# Patient Record
Sex: Female | Born: 1965 | Race: Black or African American | Hispanic: No | Smoking: Never smoker
Health system: Southern US, Community
[De-identification: ages and names within clinical notes are randomized; demographics above are authoritative.]

## PROBLEM LIST (undated history)

## (undated) DIAGNOSIS — K429 Umbilical hernia without obstruction or gangrene: Secondary | ICD-10-CM

## (undated) DIAGNOSIS — T7840XA Allergy, unspecified, initial encounter: Secondary | ICD-10-CM

## (undated) DIAGNOSIS — D649 Anemia, unspecified: Secondary | ICD-10-CM

## (undated) DIAGNOSIS — D259 Leiomyoma of uterus, unspecified: Secondary | ICD-10-CM

## (undated) DIAGNOSIS — E559 Vitamin D deficiency, unspecified: Secondary | ICD-10-CM

## (undated) DIAGNOSIS — N841 Polyp of cervix uteri: Secondary | ICD-10-CM

## (undated) DIAGNOSIS — N920 Excessive and frequent menstruation with regular cycle: Secondary | ICD-10-CM

## (undated) HISTORY — DX: Leiomyoma of uterus, unspecified: D25.9

## (undated) HISTORY — PX: EXTERNAL EAR SURGERY: SHX627

## (undated) HISTORY — DX: Allergy, unspecified, initial encounter: T78.40XA

## (undated) HISTORY — DX: Umbilical hernia without obstruction or gangrene: K42.9

## (undated) HISTORY — DX: Vitamin D deficiency, unspecified: E55.9

## (undated) HISTORY — DX: Excessive and frequent menstruation with regular cycle: N92.0

## (undated) HISTORY — PX: INNER EAR SURGERY: SHX679

## (undated) HISTORY — DX: Polyp of cervix uteri: N84.1

## (undated) HISTORY — PX: BREAST REDUCTION SURGERY: SHX8

---

## 1992-09-06 HISTORY — PX: REDUCTION MAMMAPLASTY: SUR839

## 2000-09-06 HISTORY — PX: BUNIONECTOMY: SHX129

## 2010-06-21 ENCOUNTER — Emergency Department (HOSPITAL_COMMUNITY): Admission: EM | Admit: 2010-06-21 | Discharge: 2010-06-21 | Payer: Self-pay | Admitting: Emergency Medicine

## 2011-08-30 ENCOUNTER — Emergency Department (HOSPITAL_COMMUNITY)
Admission: EM | Admit: 2011-08-30 | Discharge: 2011-08-30 | Discharge status: Against Medical Advice | Payer: Self-pay | Attending: Emergency Medicine | Admitting: Emergency Medicine

## 2011-08-30 DIAGNOSIS — Z0389 Encounter for observation for other suspected diseases and conditions ruled out: Secondary | ICD-10-CM | POA: Insufficient documentation

## 2011-08-30 NOTE — ED Notes (Signed)
patietn wanted to know she needed to be seen.

## 2012-03-17 ENCOUNTER — Encounter (INDEPENDENT_AMBULATORY_CARE_PROVIDER_SITE_OTHER): Payer: Self-pay

## 2012-03-20 ENCOUNTER — Ambulatory Visit (INDEPENDENT_AMBULATORY_CARE_PROVIDER_SITE_OTHER): Payer: BC Managed Care – PPO | Admitting: General Surgery

## 2012-03-20 ENCOUNTER — Encounter (INDEPENDENT_AMBULATORY_CARE_PROVIDER_SITE_OTHER): Payer: Self-pay | Admitting: General Surgery

## 2012-03-20 VITALS — BP 108/74 | HR 72 | Temp 98.8°F | Resp 12 | Ht 64.75 in | Wt 110.2 lb

## 2012-03-20 DIAGNOSIS — K429 Umbilical hernia without obstruction or gangrene: Secondary | ICD-10-CM | POA: Insufficient documentation

## 2012-03-20 NOTE — Progress Notes (Signed)
Patient ID: Whitney Le, female   DOB: 02/09/1966, 46 y.o.   MRN: [id number]  Chief Complaint  Patient presents with  . Umbilical Hernia    new pt- eval supra umb hernia    HPI Whitney Le is a 46 y.o. female.   HPI She is referred by Dr. Cherly Hensen for evaluation of an umbilical hernia. She has noticed a fullness around the umbilical area for a long time. When she eats a lot of food she notices it being more prominent.  It does not cause her any pain. It has not been getting larger. It is tender to the touch. She is going to have a hysterectomy by Dr. Cherly Hensen for symptomatic uterine fibroids and the thought was she could get her umbilical hernia repair at the same time.   No obstructive symptoms.  She notes that she has been slow to recover from her past surgeries.  Past Medical History  Diagnosis Date  . Cervical polyp   . Allergy     Past Surgical History  Procedure Date  . Bunionectomy 2002  . Inner ear surgery   . Breast reduction surgery     Family History  Problem Relation Age of Onset  . Cancer Mother     breast  . Cancer Sister     breast    Social History History  Substance Use Topics  . Smoking status: Never Smoker   . Smokeless tobacco: Not on file  . Alcohol Use: No    Allergies  Allergen Reactions  . Honey Bee Treatment (Bee Venom)   . Penicillins     Current Outpatient Prescriptions  Medication Sig Dispense Refill  . IRON CR PO Take by mouth.        Review of Systems Review of Systems  Constitutional: Negative.   Respiratory: Negative.   Cardiovascular: Negative.   Gastrointestinal: Negative.     Blood pressure 108/74, pulse 72, temperature 98.8 F (37.1 C), temperature source Temporal, resp. rate 12, height 5' 4.75" (1.645 m), weight 110 lb 3.2 oz (49.986 kg).  Physical Exam Physical Exam  Constitutional:       Thin female in NAD.  HENT:  Head: Normocephalic and atraumatic.  Eyes: No scleral icterus.  Abdominal: Soft. She  exhibits no distension. There is no tenderness.       Reducible, slightly tender, small supraumbilical hernia.  Diastasis recti is also present.  Musculoskeletal: She exhibits no edema.    Data Reviewed Dr.  Purnell Shoemaker notes.  Assessment    Small umbilical hernia with a very small fascial defect. Risk of intestinal incarceration is very small at this point. It has not changed in size.  Options at this point are elective repair or expectant management.    Plan    We discussed umbilical hernia repair with and without mesh. She is very concerned about the scar and I told her this would not be scarless surgery. We also discussed the cosmetic result regarding with the umbilicus which looked like. If she were going to have a laparoscopic robotic type surgery when the port site incision could go through the hernia and it be repaired primarily following that. Dr. Cherly Hensen has given her a number of options of how to perform the hysterectomy.  Whitney Le is going to see her and talk about what she options would be best for her. If she would like to have the hernia repaired at the same time I would be happy to coordinate that with Dr. Cherly Hensen.  I have discussed the procedure, risks, and aftercare.  Risks include but are not limited to bleeding, infection, wound problems, anesthesia, recurrence, injury to intestine, mesh problems.  We also discussed not knowing what the umbilicus would look like in the future.  She seems to understand.  I asked her to call us back if she would like to have the repair done.       Tiernan Millikin J 03/20/2012, 10:43 AM

## 2012-03-20 NOTE — Patient Instructions (Signed)
Call us if you would like to have your hernia fixed at the same time you are having your hysterectomy.

## 2012-03-29 ENCOUNTER — Encounter (HOSPITAL_COMMUNITY): Payer: Self-pay | Admitting: Pharmacy Technician

## 2012-03-29 ENCOUNTER — Other Ambulatory Visit: Payer: Self-pay | Admitting: Obstetrics and Gynecology

## 2012-04-03 ENCOUNTER — Encounter (HOSPITAL_COMMUNITY)
Admission: RE | Admit: 2012-04-03 | Discharge: 2012-04-03 | Disposition: A | Discharge status: Home / Self Care | Payer: BC Managed Care – PPO | Source: Ambulatory Visit | Attending: Obstetrics and Gynecology | Admitting: Obstetrics and Gynecology

## 2012-04-03 ENCOUNTER — Encounter (HOSPITAL_COMMUNITY): Payer: Self-pay

## 2012-04-03 HISTORY — DX: Anemia, unspecified: D64.9

## 2012-04-03 LAB — BASIC METABOLIC PANEL
GFR calc Af Amer: >90 mL/min (ref >90)
GFR calc non Af Amer: 79 mL/min — ABNORMAL LOW (ref >90)
Glucose, Bld: 86 mg/dL (ref 70–99)
Potassium: 3.9 mEq/L (ref 3.5–5.1)
Sodium: 140 mEq/L (ref 135–145)

## 2012-04-03 LAB — SURGICAL PCR SCREEN
MRSA, PCR: NEGATIVE
Staphylococcus aureus: NEGATIVE

## 2012-04-03 LAB — CBC
Hemoglobin: 12.6 g/dL (ref 12.0–15.0)
RBC: 4.56 MIL/uL (ref 3.87–5.11)

## 2012-04-03 NOTE — Patient Instructions (Signed)
Whitney Le  04/03/2012   Your procedure is scheduled on:  04/07/12  Surgery 1300-1500  FRIDAY  Report to [facility] at  1030     AM.  Call this number if you have problems the morning of surgery: [phone]     Or PST   [id number]  [facility]   Remember:   Do not eat food or drink any :After Midnight. Thursday NIGHT  Or as directed by office    Nothing after midnight Thursday NIGHT  Take these medicines the morning of surgery with A SIP OF WATER:none   Do not wear jewelry, make-up or nail polish.  Do not wear lotions, powders, or perfumes. You may wear deodorant.  Do not shave 48 hours prior to surgery.  Do not bring valuables to the hospital.  Contacts, dentures or bridgework may not be worn into surgery.  Leave suitcase in the car. After surgery it may be brought to your room.  For patients admitted to the hospital, checkout time is 11:00 AM the day of discharge.   Patients discharged the day of surgery will not be allowed to drive home.  Name and phone number of your driver:  daughter                                                                    Special Instructions: CHG Shower Use Special Wash: 1/2 bottle night before surgery and 1/2 bottle morning of surgery. REGULAR SOAP FACE AND PRIVATES              LADIES- NO SHAVING 48 HOURS BEFORE USING BETASEPT SOAP.                 Please read over the following fact sheets that you were given: MRSA Information

## 2012-04-03 NOTE — Pre-Procedure Instructions (Signed)
Left voice mail with Hughes Better at Dr Cherly Hensen office regarding clarification of TED hose length, ?Type and Screen Order needed pre op labs?  Also requested for abnormal WBC to be reviewed by MD  Requested call back for verification of message received at 1305

## 2012-04-07 ENCOUNTER — Encounter (HOSPITAL_COMMUNITY)
Admission: RE | Disposition: A | Discharge status: Home / Self Care | Payer: Self-pay | Source: Ambulatory Visit | Attending: Obstetrics and Gynecology

## 2012-04-07 ENCOUNTER — Encounter (HOSPITAL_COMMUNITY): Payer: Self-pay | Admitting: *Deleted

## 2012-04-07 ENCOUNTER — Encounter (HOSPITAL_COMMUNITY): Payer: Self-pay | Admitting: Anesthesiology

## 2012-04-07 ENCOUNTER — Other Ambulatory Visit: Payer: Self-pay

## 2012-04-07 ENCOUNTER — Ambulatory Visit (HOSPITAL_COMMUNITY)
Admission: RE | Admit: 2012-04-07 | Discharge: 2012-04-08 | Disposition: A | Discharge status: Home / Self Care | Payer: BC Managed Care – PPO | Source: Ambulatory Visit | Attending: Obstetrics and Gynecology | Admitting: Obstetrics and Gynecology

## 2012-04-07 ENCOUNTER — Ambulatory Visit (HOSPITAL_COMMUNITY): Payer: BC Managed Care – PPO | Admitting: Anesthesiology

## 2012-04-07 DIAGNOSIS — Z90710 Acquired absence of both cervix and uterus: Secondary | ICD-10-CM

## 2012-04-07 DIAGNOSIS — D259 Leiomyoma of uterus, unspecified: Principal | ICD-10-CM | POA: Insufficient documentation

## 2012-04-07 DIAGNOSIS — Z01812 Encounter for preprocedural laboratory examination: Secondary | ICD-10-CM | POA: Insufficient documentation

## 2012-04-07 DIAGNOSIS — N920 Excessive and frequent menstruation with regular cycle: Secondary | ICD-10-CM | POA: Insufficient documentation

## 2012-04-07 DIAGNOSIS — D649 Anemia, unspecified: Secondary | ICD-10-CM | POA: Insufficient documentation

## 2012-04-07 HISTORY — PX: ABDOMINAL HYSTERECTOMY: SHX81

## 2012-04-07 LAB — GLUCOSE, CAPILLARY: Glucose-Capillary: 155 mg/dL — ABNORMAL HIGH (ref 70–99)

## 2012-04-07 LAB — TYPE AND SCREEN

## 2012-04-07 SURGERY — ROBOTIC ASSISTED TOTAL HYSTERECTOMY
Anesthesia: General | Site: Abdomen | Wound class: Clean Contaminated

## 2012-04-07 MED ORDER — KETOROLAC TROMETHAMINE 30 MG/ML IJ SOLN
30.0000 mg | Freq: Four times a day (QID) | INTRAMUSCULAR | Status: DC
  Administered 2012-04-07 – 2012-04-08 (×2): 30 mg via INTRAVENOUS
  Filled 2012-04-07 (×5): qty 1

## 2012-04-07 MED ORDER — MENTHOL 3 MG MT LOZG: 1.0000 | LOZENGE | OROMUCOSAL | Status: DC | PRN

## 2012-04-07 MED ORDER — STERILE WATER FOR IRRIGATION IR SOLN
Status: DC | PRN
  Administered 2012-04-07: 3000 mL

## 2012-04-07 MED ORDER — IBUPROFEN 800 MG PO TABS: 800.0000 mg | ORAL_TABLET | Freq: Three times a day (TID) | ORAL | Status: DC | PRN

## 2012-04-07 MED ORDER — ONDANSETRON HCL 4 MG/2ML IJ SOLN: 4.0000 mg | Freq: Four times a day (QID) | INTRAMUSCULAR | Status: DC | PRN

## 2012-04-07 MED ORDER — GLYCOPYRROLATE 0.2 MG/ML IJ SOLN
INTRAMUSCULAR | Status: DC | PRN
  Administered 2012-04-07: 0.4 mg via INTRAVENOUS

## 2012-04-07 MED ORDER — OXYCODONE-ACETAMINOPHEN 5-325 MG PO TABS: 1.0000 | ORAL_TABLET | ORAL | Status: DC | PRN

## 2012-04-07 MED ORDER — BUPIVACAINE HCL (PF) 0.25 % IJ SOLN
INTRAMUSCULAR | Status: AC
  Filled 2012-04-07: qty 30

## 2012-04-07 MED ORDER — ZOLPIDEM TARTRATE 5 MG PO TABS: 5.0000 mg | ORAL_TABLET | Freq: Every evening | ORAL | Status: DC | PRN

## 2012-04-07 MED ORDER — DEXAMETHASONE SODIUM PHOSPHATE 10 MG/ML IJ SOLN
INTRAMUSCULAR | Status: DC | PRN
  Administered 2012-04-07: 10 mg via INTRAVENOUS

## 2012-04-07 MED ORDER — BUPIVACAINE HCL 0.25 % IJ SOLN
INTRAMUSCULAR | Status: DC | PRN
  Administered 2012-04-07: 10 mL

## 2012-04-07 MED ORDER — HYDROMORPHONE HCL PF 1 MG/ML IJ SOLN
INTRAMUSCULAR | Status: AC
  Filled 2012-04-07: qty 1

## 2012-04-07 MED ORDER — NALOXONE HCL 0.4 MG/ML IJ SOLN: 0.4000 mg | INTRAMUSCULAR | Status: DC | PRN

## 2012-04-07 MED ORDER — LACTATED RINGERS IV SOLN
INTRAVENOUS | Status: DC | PRN
  Administered 2012-04-07 (×2): via INTRAVENOUS

## 2012-04-07 MED ORDER — ONDANSETRON HCL 4 MG/2ML IJ SOLN
INTRAMUSCULAR | Status: DC | PRN
  Administered 2012-04-07: 4 mg via INTRAVENOUS

## 2012-04-07 MED ORDER — PROPOFOL 10 MG/ML IV BOLUS
INTRAVENOUS | Status: DC | PRN
  Administered 2012-04-07: 100 mg via INTRAVENOUS

## 2012-04-07 MED ORDER — LACTATED RINGERS IV SOLN
INTRAVENOUS | Status: DC
  Administered 2012-04-07: 1000 mL via INTRAVENOUS

## 2012-04-07 MED ORDER — CIPROFLOXACIN IN D5W 400 MG/200ML IV SOLN
INTRAVENOUS | Status: AC
  Filled 2012-04-07: qty 200

## 2012-04-07 MED ORDER — FENTANYL CITRATE 0.05 MG/ML IJ SOLN
INTRAMUSCULAR | Status: DC | PRN
  Administered 2012-04-07: 50 ug via INTRAVENOUS
  Administered 2012-04-07: 100 ug via INTRAVENOUS
  Administered 2012-04-07 (×3): 50 ug via INTRAVENOUS

## 2012-04-07 MED ORDER — PANTOPRAZOLE SODIUM 40 MG PO TBEC: 40.0000 mg | DELAYED_RELEASE_TABLET | Freq: Every day | ORAL | Status: DC

## 2012-04-07 MED ORDER — CLINDAMYCIN PHOSPHATE 900 MG/50ML IV SOLN
INTRAVENOUS | Status: AC
  Filled 2012-04-07: qty 50

## 2012-04-07 MED ORDER — NEOSTIGMINE METHYLSULFATE 1 MG/ML IJ SOLN
INTRAMUSCULAR | Status: DC | PRN
  Administered 2012-04-07: 5 mg via INTRAVENOUS

## 2012-04-07 MED ORDER — DIPHENHYDRAMINE HCL 50 MG/ML IJ SOLN: 12.5000 mg | Freq: Four times a day (QID) | INTRAMUSCULAR | Status: DC | PRN

## 2012-04-07 MED ORDER — KETOROLAC TROMETHAMINE 30 MG/ML IJ SOLN: 30.0000 mg | Freq: Four times a day (QID) | INTRAMUSCULAR | Status: DC

## 2012-04-07 MED ORDER — LACTATED RINGERS IR SOLN
Status: DC | PRN
  Administered 2012-04-07: 1000 mL

## 2012-04-07 MED ORDER — CIPROFLOXACIN IN D5W 400 MG/200ML IV SOLN
400.0000 mg | INTRAVENOUS | Status: AC
  Administered 2012-04-07: 400 mg via INTRAVENOUS

## 2012-04-07 MED ORDER — ONDANSETRON HCL 4 MG PO TABS: 4.0000 mg | ORAL_TABLET | Freq: Four times a day (QID) | ORAL | Status: DC | PRN

## 2012-04-07 MED ORDER — KETOROLAC TROMETHAMINE 30 MG/ML IJ SOLN
30.0000 mg | Freq: Four times a day (QID) | INTRAMUSCULAR | Status: DC
  Filled 2012-04-07 (×5): qty 1

## 2012-04-07 MED ORDER — ONDANSETRON HCL 4 MG/2ML IJ SOLN
4.0000 mg | Freq: Four times a day (QID) | INTRAMUSCULAR | Status: DC | PRN
  Filled 2012-04-07: qty 2

## 2012-04-07 MED ORDER — HYDROMORPHONE HCL PF 1 MG/ML IJ SOLN
0.2500 mg | INTRAMUSCULAR | Status: DC | PRN
  Administered 2012-04-07 (×2): 0.5 mg via INTRAVENOUS

## 2012-04-07 MED ORDER — MIDAZOLAM HCL 5 MG/5ML IJ SOLN
INTRAMUSCULAR | Status: DC | PRN
  Administered 2012-04-07 (×2): 1 mg via INTRAVENOUS

## 2012-04-07 MED ORDER — HYDROMORPHONE 0.3 MG/ML IV SOLN
INTRAVENOUS | Status: AC
  Filled 2012-04-07: qty 25

## 2012-04-07 MED ORDER — HYDROMORPHONE HCL PF 1 MG/ML IJ SOLN: 0.2000 mg | INTRAMUSCULAR | Status: DC | PRN

## 2012-04-07 MED ORDER — SODIUM CHLORIDE 0.9 % IJ SOLN: 9.0000 mL | INTRAMUSCULAR | Status: DC | PRN

## 2012-04-07 MED ORDER — KETOROLAC TROMETHAMINE 30 MG/ML IJ SOLN
INTRAMUSCULAR | Status: DC | PRN
  Administered 2012-04-07: 30 mg via INTRAVENOUS

## 2012-04-07 MED ORDER — HYDROMORPHONE 0.3 MG/ML IV SOLN
INTRAVENOUS | Status: DC
  Administered 2012-04-07: 0.2 mg via INTRAVENOUS
  Administered 2012-04-08: 0.399 mg via INTRAVENOUS
  Administered 2012-04-08: 0.3 mg via INTRAVENOUS

## 2012-04-07 MED ORDER — DEXTROSE IN LACTATED RINGERS 5 % IV SOLN
INTRAVENOUS | Status: DC
  Administered 2012-04-08: 04:00:00 via INTRAVENOUS

## 2012-04-07 MED ORDER — DIPHENHYDRAMINE HCL 12.5 MG/5ML PO ELIX: 12.5000 mg | ORAL_SOLUTION | Freq: Four times a day (QID) | ORAL | Status: DC | PRN

## 2012-04-07 MED ORDER — ACETAMINOPHEN 10 MG/ML IV SOLN
INTRAVENOUS | Status: DC | PRN
  Administered 2012-04-07: 1000 mg via INTRAVENOUS

## 2012-04-07 MED ORDER — ACETAMINOPHEN 10 MG/ML IV SOLN
INTRAVENOUS | Status: AC
  Filled 2012-04-07: qty 100

## 2012-04-07 MED ORDER — ROCURONIUM BROMIDE 100 MG/10ML IV SOLN
INTRAVENOUS | Status: DC | PRN
  Administered 2012-04-07 (×2): 10 mg via INTRAVENOUS
  Administered 2012-04-07: 5 mg via INTRAVENOUS
  Administered 2012-04-07: 40 mg via INTRAVENOUS

## 2012-04-07 MED ORDER — CLINDAMYCIN PHOSPHATE 900 MG/50ML IV SOLN
900.0000 mg | INTRAVENOUS | Status: AC
  Administered 2012-04-07: 900 mg via INTRAVENOUS
  Filled 2012-04-07 (×2): qty 50

## 2012-04-07 MED ORDER — PANTOPRAZOLE SODIUM 40 MG PO TBEC
40.0000 mg | DELAYED_RELEASE_TABLET | Freq: Every day | ORAL | Status: DC
  Filled 2012-04-07: qty 1

## 2012-04-07 SURGICAL SUPPLY — 60 items
BARRIER ADHS 3X4 INTERCEED (GAUZE/BANDAGES/DRESSINGS) IMPLANT
BENZOIN TINCTURE PRP APPL 2/3 (GAUZE/BANDAGES/DRESSINGS) IMPLANT
CHLORAPREP W/TINT 26ML (MISCELLANEOUS) ×4 IMPLANT
CLOTH BEACON ORANGE TIMEOUT ST (SAFETY) ×4 IMPLANT
COVER MAYO STAND STRL (DRAPES) ×4 IMPLANT
COVER SURGICAL LIGHT HANDLE (MISCELLANEOUS) ×4 IMPLANT
COVER TIP SHEARS 8 DVNC (MISCELLANEOUS) ×3 IMPLANT
COVER TIP SHEARS 8MM DA VINCI (MISCELLANEOUS) ×1
DECANTER SPIKE VIAL GLASS SM (MISCELLANEOUS) IMPLANT
DERMABOND ADVANCED (GAUZE/BANDAGES/DRESSINGS) ×1
DERMABOND ADVANCED .7 DNX12 (GAUZE/BANDAGES/DRESSINGS) ×3 IMPLANT
DRAPE LG THREE QUARTER DISP (DRAPES) ×8 IMPLANT
DRAPE SURG IRRIG POUCH 19X23 (DRAPES) ×4 IMPLANT
DRAPE TABLE BACK 44X90 PK DISP (DRAPES) ×8 IMPLANT
DRAPE WARM FLUID 44X44 (DRAPE) ×4 IMPLANT
DRSG TEGADERM 6X8 (GAUZE/BANDAGES/DRESSINGS) ×8 IMPLANT
ELECT REM PT RETURN 9FT ADLT (ELECTROSURGICAL) ×4
ELECTRODE REM PT RTRN 9FT ADLT (ELECTROSURGICAL) ×3 IMPLANT
FILTER SMOKE EVAC LAPAROSHD (FILTER) ×4 IMPLANT
GAUZE VASELINE 3X9 (GAUZE/BANDAGES/DRESSINGS) IMPLANT
GLOVE BIOGEL PI IND STRL 7.0 (GLOVE) ×6 IMPLANT
GLOVE BIOGEL PI INDICATOR 7.0 (GLOVE) ×2
GLOVE ECLIPSE 6.5 STRL STRAW (GLOVE) ×16 IMPLANT
GOWN STRL NON-REIN LRG LVL3 (GOWN DISPOSABLE) ×12 IMPLANT
KIT ACCESSORY DA VINCI DISP (KITS) ×1
KIT ACCESSORY DVNC DISP (KITS) ×3 IMPLANT
MANIPULATOR UTERINE 4.5 ZUMI (MISCELLANEOUS) IMPLANT
NEEDLE INSUFFLATION 14GA 120MM (NEEDLE) ×4 IMPLANT
OCCLUDER COLPOPNEUMO (BALLOONS) IMPLANT
PACK LAPAROSCOPY W LONG (CUSTOM PROCEDURE TRAY) ×4 IMPLANT
PENCIL BUTTON HOLSTER BLD 10FT (ELECTRODE) ×4 IMPLANT
SET TUBE IRRIG SUCTION NO TIP (IRRIGATION / IRRIGATOR) ×4 IMPLANT
SHEET LAVH (DRAPES) ×4 IMPLANT
SLEEVE SURGEON STRL (DRAPES) ×4 IMPLANT
SOLUTION ELECTROLUBE (MISCELLANEOUS) ×4 IMPLANT
SPONGE LAP 18X18 X RAY DECT (DISPOSABLE) IMPLANT
STRIP CLOSURE SKIN 1/2X4 (GAUZE/BANDAGES/DRESSINGS) IMPLANT
SUT VIC AB 0 CT1 27 (SUTURE) ×7
SUT VIC AB 0 CT1 27XBRD ANTBC (SUTURE) ×21 IMPLANT
SUT VIC AB 4-0 PS2 27 (SUTURE) ×8 IMPLANT
SUT VICRYL 0 UR6 27IN ABS (SUTURE) ×4 IMPLANT
SYR 20CC LL (SYRINGE) ×8 IMPLANT
SYR 50ML LL SCALE MARK (SYRINGE) ×4 IMPLANT
SYSTEM CONVERTIBLE TROCAR (TROCAR) ×4 IMPLANT
TIP UTERINE 5.1X6CM LAV DISP (MISCELLANEOUS) IMPLANT
TIP UTERINE 6.7X10CM GRN DISP (MISCELLANEOUS) ×4 IMPLANT
TIP UTERINE 6.7X6CM WHT DISP (MISCELLANEOUS) IMPLANT
TIP UTERINE 6.7X8CM BLUE DISP (MISCELLANEOUS) IMPLANT
TOWEL OR 17X26 10 PK STRL BLUE (TOWEL DISPOSABLE) ×8 IMPLANT
TRAY FOLEY CATH 14FRSI W/METER (CATHETERS) ×4 IMPLANT
TROCAR 12M 150ML BLUNT (TROCAR) IMPLANT
TROCAR BLADELESS OPT 5 75 (ENDOMECHANICALS) ×4 IMPLANT
TROCAR DISP BLADELESS 8 DVNC (TROCAR) ×3 IMPLANT
TROCAR DISP BLADELESS 8MM (TROCAR) ×1
TROCAR HASSON GELL 12X100 (TROCAR) ×4 IMPLANT
TROCAR KII 12MM C0R66 BLD (TROCAR) IMPLANT
TROCAR XCEL 12X100 BLDLESS (ENDOMECHANICALS) ×4 IMPLANT
TROCAR Z-THREAD 12X150 (TROCAR) ×4 IMPLANT
TUBING FILTER THERMOFLATOR (ELECTROSURGICAL) ×4 IMPLANT
WATER STERILE IRR 1500ML POUR (IV SOLUTION) ×8 IMPLANT

## 2012-04-07 NOTE — Transfer of Care (Signed)
Immediate Anesthesia Transfer of Care Note  Patient: Whitney Le  Procedure(s) Performed: Procedure(s) (LRB): ROBOTIC ASSISTED TOTAL HYSTERECTOMY (N/A) ROBOTIC ASSISTED SALPINGO OOPHERECTOMY (N/A)  Patient Location: PACU  Anesthesia Type: General  Level of Consciousness: awake, patient cooperative, lethargic and responds to stimulation  Airway & Oxygen Therapy: Patient Spontanous Breathing and Patient connected to face mask oxygen  Post-op Assessment: Report given to PACU RN, Post -op Vital signs reviewed and stable and Patient moving all extremities  Post vital signs: Reviewed and stable  Complications: No apparent anesthesia complications

## 2012-04-07 NOTE — Preoperative (Addendum)
Beta Blockers   Reason not to administer Beta Blockers:Not Applicable 

## 2012-04-07 NOTE — Anesthesia Preprocedure Evaluation (Signed)
Anesthesia Evaluation  Patient identified by MRN, date of birth, ID band Patient awake    Reviewed: Allergy & Precautions, H&P , NPO status , Patient's Chart, lab work & pertinent test results, reviewed documented beta blocker date and time   Airway Mallampati: II TM Distance: >3 FB Neck ROM: Full    Dental  (+) Teeth Intact and Dental Advisory Given   Pulmonary neg pulmonary ROS,  breath sounds clear to auscultation        Cardiovascular negative cardio ROS  Rhythm:Regular Rate:Normal  Denies cardiac symptoms   Neuro/Psych negative neurological ROS  negative psych ROS   GI/Hepatic negative GI ROS, Neg liver ROS,   Endo/Other  negative endocrine ROS  Renal/GU negative Renal ROS   Fibroids    Musculoskeletal negative musculoskeletal ROS (+)   Abdominal   Peds negative pediatric ROS (+)  Hematology negative hematology ROS (+)   Anesthesia Other Findings   Reproductive/Obstetrics negative OB ROS                           Anesthesia Physical Anesthesia Plan  ASA: I  Anesthesia Plan: General   Post-op Pain Management:    Induction: Intravenous  Airway Management Planned: Oral ETT  Additional Equipment:   Intra-op Plan:   Post-operative Plan: Extubation in OR  Informed Consent: I have reviewed the patients History and Physical, chart, labs and discussed the procedure including the risks, benefits and alternatives for the proposed anesthesia with the patient or authorized representative who has indicated his/her understanding and acceptance.   Dental advisory given  Plan Discussed with: CRNA and Surgeon  Anesthesia Plan Comments:         Anesthesia Quick Evaluation

## 2012-04-07 NOTE — Progress Notes (Signed)
Called to room 1232 at 2030 per Dondra Spry. RN 5 W concerning pt sob low 02 sats and chest pressure. Pt had hysterectomy today and is admitted to 5 west for observation overnight. Upon my arriva Pt complaining of weakness, denies pain or chest pressure at this time.  Po2 on pca monitor reading 86-89, lung sounds clear, EKG completed in room reading NSR.  Pt placed on RRT monitor yielding o2 sats 100%. New probe placed on pca 02 sensor, with sensor now correlating well with RRT monitor. Pt and sister at bedside educated on side effect post op including weakness and gas pressure after lap surgery. Encouraged to use PCA button when needed for pain. Page sent out to Dr Cherly Hensen per Graciella Belton RN to update on pt status. Pt left resting in bed VSS. Se VS on flowsheet.

## 2012-04-07 NOTE — Brief Op Note (Signed)
04/07/2012  4:44 PM  PATIENT: Whitney Le PRE-OPERATIVE DIAGNOSIS:  fibroids and menorrhagia  POST-OPERATIVE DIAGNOSIS:  fibroids and menorrhagia  PROCEDURE: DaVinci robotic total hysterectomy, bilateral salpingectomy  SURGEON:  Surgeon(s): Yasseen Salls Cathie Beams, MD Genia Del, MD  ASSISTANTS: Genia Del MD   ANESTHESIA:   general  FINDINGS:fibroid uterus, nl tubes and ovaries, pt is on cycle, nl liver edge    ESTIMATED BLOOD LOSS: 50  Intake/Output Summary (Last 24 hours) at 04/07/12 1644 Last data filed at 04/07/12 1600  Gross per 24 hour  Intake   2000 ml  Output    125 ml  Net   1875 ml     BLOOD ADMINISTERED:none   SPECIMEN: uterus w/ cervix  DISPOSITION OF SPECIMEN:  PATHOLOGY  COUNTS:  YES  PLAN OF CARE: overnight observation

## 2012-04-08 LAB — BASIC METABOLIC PANEL
Calcium: 8.2 mg/dL — ABNORMAL LOW (ref 8.4–10.5)
GFR calc Af Amer: >90 mL/min (ref >90)
GFR calc non Af Amer: >90 mL/min (ref >90)
Potassium: 4.4 mEq/L (ref 3.5–5.1)
Sodium: 132 mEq/L — ABNORMAL LOW (ref 135–145)

## 2012-04-08 LAB — CBC
Hemoglobin: 10.9 g/dL — ABNORMAL LOW (ref 12.0–15.0)
MCH: 27.9 pg (ref 26.0–34.0)
MCHC: 33.5 g/dL (ref 30.0–36.0)
Platelets: 235 10*3/uL (ref 150–400)
RDW: 14.4 % (ref 11.5–15.5)

## 2012-04-08 MED ORDER — PROMETHAZINE HCL 50 MG PO TABS: 25.0000 mg | ORAL_TABLET | Freq: Four times a day (QID) | ORAL | Status: DC | PRN

## 2012-04-08 NOTE — Anesthesia Postprocedure Evaluation (Signed)
  Anesthesia Post-op Note  Patient: Whitney Le  Procedure(s) Performed: Procedure(s) (LRB): ROBOTIC ASSISTED TOTAL HYSTERECTOMY (N/A) BILATERAL SALPINGECTOMY ()  Patient Location: PACU  Anesthesia Type: General  Level of Consciousness: oriented and sedated  Airway and Oxygen Therapy: Patient Spontanous Breathing and Patient connected to nasal cannula oxygen  Post-op Pain: mild  Post-op Assessment: Post-op Vital signs reviewed, Patient's Cardiovascular Status Stable, Respiratory Function Stable and Patent Airway  Post-op Vital Signs: stable  Complications: No apparent anesthesia complications

## 2012-04-08 NOTE — Discharge Summary (Signed)
Physician Discharge Summary  Patient ID: Whitney Le MRN: [id number] DOB/AGE: Aug 30, 1966 46 y.o.  Admit date: 04/07/2012 Discharge date: 04/08/2012  Admission Diagnoses: fibroid uterus, menorrhagia  Discharge Diagnoses: fibroid uterus, menorrhagia Active Problems:  * No active hospital problems. *    Discharged Condition: stable  Hospital Course: uncomplicated postop course. S/p robotic Total hysterectomy, bilateral salpingectomy. Had transient nausea prob related to anesthesia. Abdomen nondistended Consults: None  Significant Diagnostic Studies: labs: hgb 10.9 hct 32.5 plt 235K, wbc 8.7  Creatinine 0.74  Treatments: surgery: Davinci robotic total hysterectomy, bilateral slapingectomy  Discharge Exam: Blood pressure 92/58, pulse 72, temperature 98.4 F (36.9 C), temperature source Oral, resp. rate 21, height 5\' 4"  (1.626 m), weight 50.803 kg (112 lb), SpO2 100.00%. General appearance: alert, cooperative and no distress Resp: clear to auscultation bilaterally Cardio: regular rate and rhythm, S1, S2 normal, no murmur, click, rub or gallop GI: soft, non-tender; bowel sounds normal; no masses,  no organomegaly Extremities: no edema, redness or tenderness in the calves or thighs Incision/Wound:incisions well approximated, nontender  Disposition: 07-Left Against Medical Advice  Discharge Orders    Future Orders Please Complete By Expires   Diet general      May walk up steps      Discharge instructions      Comments:   Call if temperature greater than equal to 100.4, nothing per vagina for 4-6 weeks or severe nausea vomiting, increased incisional pain , drainage or redness in the incision site, no straining with bowel movements, showers no bath   Discharge patient        Medication List  As of 04/08/2012  8:55 AM   TAKE these medications         ferrous sulfate 325 (65 FE) MG tablet   Take 325 mg by mouth daily with breakfast.      promethazine 50 MG tablet   Commonly  known as: PHENERGAN   Take 0.5 tablets (25 mg total) by mouth every 6 (six) hours as needed for nausea.           Follow-up Information    Follow up with Barkley Kratochvil A, MD in 2 weeks.   Contact information:   [address] [place] [address] [phone]          Signed: Serita Kyle 04/08/2012, 8:55 AM

## 2012-04-08 NOTE — Op Note (Signed)
Whitney Le, BEAUFORT NO.:  [id number]  MEDICAL RECORD NO.:  [id number]  LOCATION:  1532                         FACILITY:  [facility]  PHYSICIAN:  Maxie Better, M.D.DATE OF BIRTH:  August 06, 1966  DATE OF PROCEDURE:  04/07/2012 DATE OF DISCHARGE:                              OPERATIVE REPORT   PREOPERATIVE DIAGNOSES:  Menorrhagia, fibroid uterus.  PROCEDURES:  Da Vinci robotic total hysterectomy and bilateral salpingectomy.  POSTOPERATIVE DIAGNOSIS:  Fibroid uterus, menorrhagia.  ANESTHESIA:  General.  SURGEON:  Maxie Better, M.D.  ASSISTANT:  Genia Del, M.D.  PROCEDURE DETAILS:  Under adequate general anesthesia, the patient was placed in the dorsal lithotomy position.  She was sterilely prepped and draped in the usual fashion.  The patient was positioned for robotic surgery.  Examination under anesthesia had revealed an anteverted uterus about 10-week size, irregular, no adnexal masses could be appreciated. A weighted speculum was placed in the vagina.  Sims retractor was placed anteriorly.  The cervix was parous.  A 0 Vicryl figure-of-eight suture was placed on the anterior posterior lip of the cervix.  The uterus sounded to 10 cm.  The cervix was already dilated.  The patient was on her cycle.  A medium sized KOH RUMI cup a #10 uterine manipulator were introduced into the uterine cavity without difficulty.  The balloon was insufflated.  The instruments were then removed.  Attention was then turned to the abdomen.  A 0.25% Marcaine was injected in the vertical fashion supraumbilically.  The incision was then made.  Veress needle was introduced, tested.  Carbon dioxide was insufflated.  Opening pressure of 6 was noted.  Veress needle was then removed after 3 L of carbon dioxide.  The 12-mm disposable trocar was introduced into the abdomen without incident.  The robotic camera was then placed.  The pelvis had some bleeding with respect to  the patient's cycles.  The patient was placed in Trendelenburg position.  The pelvis was inspected. Upper abdomen was normal.  Uterus was irregular.  Two 8-mm robotic port sites were placed on the left and one on the right, hand preservation to each other, and a 5-mm assistant port was then placed in the right lower quadrant.  The additional robotic port site was placed under direct visualization.  Once this was done, the robot was docked to the patient's left side.  The monopolar scissors in arm 1, PK dissector in arm 2, and the Prograsp in 3 was placed.  I then went to the surgical console.  At the surgical console, the pelvis was inspected.  Some areas of suggestion of endometriosis noted with some scarring, but however, the patient was on her cycle, difficult to evaluate.  The procedure was began by cauterizing the round ligaments on the right.  I severed the right broad ligaments followed by the fallopian tubing and the mesosalpinx being serially clamped, cauterized, and then cut.  The right utero-ovarian ligament was then clamped, cauterized, and cut.  The anterior and posterior leaf of the broad ligament was opened.  The bladder reflection was opened transversely.  The uterine vessel was noted.  They were cauterized, but not cut.  The right ureter as it has been seen  peristalsing the pelvis.  The bladder was then dissected off the lower uterine segment and over the RUMI cup.  On the opposite side, the ureter was not seen as well, was deep in the pelvis.  The left round ligament was cauterized, clamped, and cut.  The fallopian tube was grasped, and the underlying mesosalpinx was serially clamped, cauterized, and cut.  The left utero-ovarian ligaments were clamped, cauterized, and cut.  The broad ligament was then opened anteriorly and posteriorly.  Uterine vessels were skeletonized.  They were clearly clamped, cauterized, and then cut.  Attention was then brought back to the right  side, which was also further cauterized and cut.  Once this was done, the bladder was displaced inferiorly.  A circumferential incision was then made at the cervical vaginal junction above the RUMI cup.  This was done circumferentially.  Once this was done, the uterus was then removed through the vagina.  The vaginal cuff was inspected. Small bleeders were cauterized.  The occluder was reinserted.  The PK and the monopolar scissors were replaced by the suture driver and a long tip forceps.  A 0 Vicryl figure-of-eight sutures was then dropped down through the supraumbilical port sites, and the vaginal cuff was closed with interrupted figure-of-eight suture and one single suture of 0 Vicryl. The integrity of the vaginal cuff was then inspected.  It was intact. The abdomen was copiously irrigated and suctioned.  Good hemostasis was noted.  At that point, the procedure was felt to be complete.  The robot was undocked, and robotic camera was placed and the needles were then removed.  The pelvis was again inspected.  Good hemostasis noted.  At that point, the sites were removed under direct visualization.  The vagina had been inspected to be intact.  The incisions were closed with 0 Vicryl figure-of-eight to the subcuticular stitch and the subcuticular sutured of closure of 4-0 Vicryl.  The vagina was then again inspected and well approximated.   SPECIMEN:  Uterus with cervix and fallopian tubes sent to pathology.  ESTIMATED BLOOD LOSS:  50 mL.  INTRAOPERATIVE FLUID:  2500 mL.  URINE OUTPUT:  125 mL.  COUNTS:  Sponge and instrument counts x2 was correct.  COMPLICATION:  None.  The patient tolerated the procedure well, was transferred to recovery in stable condition.     Maxie Better, M.D.     [place]/MEDQ  D:  04/08/2012  T:  04/08/2012  Job:  [id number]

## 2012-04-08 NOTE — Progress Notes (Signed)
POD #1 S/P Robotic Total hysterectomy, bilateral salpingectomy Subjective: Patient reports no problems voiding.  Had episode of vomiting this am but feel better. Nausea resolved thereafter. Pain controlled. Ready to eat but not hungry  Objective: I have reviewed patient's vital signs.  vital signs, intake and output and labs. Filed Vitals:   04/08/12 0633  BP: 92/58  Pulse: 72  Temp: 98.4 F (36.9 C)  Resp: 21   I/O last 3 completed shifts: In: 3380 [I.V.:3380] Out: 1826 [Urine:1826]    Lab Results  Component Value Date   WBC 8.7 04/08/2012   HGB 10.9* 04/08/2012   HCT 32.5* 04/08/2012   MCV 83.3 04/08/2012   PLT 235 04/08/2012   Lab Results  Component Value Date   CREATININE 0.74 04/08/2012    EXAM General: alert, cooperative and no distress Resp: clear to auscultation bilaterally Cardio: regular rate and rhythm, S1, S2 normal, no murmur, click, rub or gallop GI: soft, non-tender; bowel sounds normal; no masses,  no organomegaly and incision: intact Extremities: no edema, redness or tenderness in the calves or thighs Vaginal Bleeding: minimal  Assessment: s/p Procedure(s):DAVINCI ROBOTIC ASSISTED TOTAL HYSTERECTOMY BILATERAL SALPINGECTOMY: stable, anemia and doing well otherwise.   Plan: Advance diet Encourage ambulation Discontinue IV fluids Discharge home D/c home  D/c instructions reviewed. F/u 2 wk for incision check. Cont iron po qd. Pt has postop pain scripts at home( percocet and motrin).  LOS: 1 day    Devaughn Savant A, MD 04/08/2012 8:45 AM    04/08/2012, 8:45 AM

## 2012-04-08 NOTE — Progress Notes (Signed)
Pt discharged to home with sister provided discharge instructions and prescriptions along with handouts. Pt verbalized understanding of discharge information. Pt stable. Pt transported by [facility] IV removed and documented. Lakysha Kossman Howell, RN    

## 2012-07-21 ENCOUNTER — Encounter (INDEPENDENT_AMBULATORY_CARE_PROVIDER_SITE_OTHER): Payer: Self-pay

## 2012-07-24 ENCOUNTER — Ambulatory Visit (INDEPENDENT_AMBULATORY_CARE_PROVIDER_SITE_OTHER): Payer: BC Managed Care – PPO | Admitting: General Surgery

## 2012-07-24 ENCOUNTER — Encounter (INDEPENDENT_AMBULATORY_CARE_PROVIDER_SITE_OTHER): Payer: Self-pay | Admitting: General Surgery

## 2012-07-24 VITALS — BP 112/76 | HR 65 | Temp 98.7°F | Ht 64.75 in | Wt 109.0 lb

## 2012-07-24 DIAGNOSIS — K460 Unspecified abdominal hernia with obstruction, without gangrene: Secondary | ICD-10-CM

## 2012-07-24 NOTE — Progress Notes (Signed)
Patient ID: Whitney Le, female   DOB: 05/25/1966, 46 y.o.   MRN: [id number]  Chief Complaint  Patient presents with  . Pre-op Exam    eval poss incisional hernia    HPI Whitney Le is a 46 y.o. female.   HPI  She presents today, sent over by Dr. Cherly Hensen, to discuss repair of an incisional hernia. I saw her back in July for an umbilical hernia. She decided to have the robotic hysterectomy and not have the hernia repaired at the same time. She then noticed a painful bulge in the epigastric region at the site of one of the trocar incisions. It bulges out when she coughs or sneezes. No obstructing symptoms.  Past Medical History  Diagnosis Date  . Cervical polyp   . Allergy   . Anemia   . Menorrhagia   . Umbilical hernia   . Fibroid uterus   . Vitamin D deficiency     Past Surgical History  Procedure Date  . Bunionectomy 2002  . Inner ear surgery   . Breast reduction surgery   . Abdominal hysterectomy 04/07/2012    Engineer, building services  . Bunionectomy 2002    bilateral  . External ear surgery     left ear    Family History  Problem Relation Age of Onset  . Cancer Mother     breast  . Cancer Sister     breast    Social History History  Substance Use Topics  . Smoking status: Never Smoker   . Smokeless tobacco: Never Used  . Alcohol Use: No    Allergies  Allergen Reactions  . Honey Bee Treatment (Bee Venom) Nausea And Vomiting    States HONEY IT SELF  . Penicillins Hives    No current outpatient prescriptions on file.    Review of Systems Review of Systems  Constitutional: Negative.   Gastrointestinal: Positive for abdominal pain.       At epigastric scar.    Blood pressure 112/76, pulse 65, temperature 98.7 F (37.1 C), temperature source Temporal, height 5' 4.75" (1.645 m), weight 109 lb (49.442 kg), SpO2 99.00%.  Physical Exam Physical Exam  Constitutional:       Thin female in NAD.  HENT:  Head: Normocephalic and atraumatic.  Cardiovascular:  Normal rate and regular rhythm.   Pulmonary/Chest: Effort normal and breath sounds normal.  Abdominal: Soft.       Reducible umbilical bulge.  Tender reducible bulge at epigastric scar.  Musculoskeletal: Normal range of motion. She exhibits no edema.    Data Reviewed Previous note.  Assessment    Symptomatic epigastric incisional hernia. Also has an umbilical hernia. She would like to get both repaired.    Plan    Repair of incisional and umbilical hernia with mesh.  I have discussed the procedure, risks, and aftercare. Risks include but are not limited to bleeding, infection, wound healing problems, anesthesia, recurrence, accidental injury to intra-abdominal organs. We also discussed not knowing how the umbilicus is going to look after the surgery. All questions were answered.       Whitney Le J 07/24/2012, 2:51 PM

## 2012-07-24 NOTE — Patient Instructions (Signed)
Avoid lifting over 20 pounds

## 2012-08-18 ENCOUNTER — Ambulatory Visit (INDEPENDENT_AMBULATORY_CARE_PROVIDER_SITE_OTHER): Payer: BC Managed Care – PPO | Admitting: General Surgery

## 2012-08-18 DIAGNOSIS — K429 Umbilical hernia without obstruction or gangrene: Secondary | ICD-10-CM

## 2012-08-18 NOTE — Patient Instructions (Signed)
Do not vary your routine this weekend.

## 2012-08-18 NOTE — Progress Notes (Signed)
She is due to have repair of her umbilical and epigastric hernias in 3 days. She has a number of questions which we discussed. These include what type of mesh is being used, handling of the pain postoperatively, technique of the surgery. All of her questions were answered.

## 2012-08-21 DIAGNOSIS — K432 Incisional hernia without obstruction or gangrene: Secondary | ICD-10-CM

## 2012-08-21 DIAGNOSIS — K429 Umbilical hernia without obstruction or gangrene: Secondary | ICD-10-CM

## 2012-08-21 HISTORY — PX: HERNIA REPAIR: SHX51

## 2012-09-04 ENCOUNTER — Encounter (INDEPENDENT_AMBULATORY_CARE_PROVIDER_SITE_OTHER): Payer: BC Managed Care – PPO | Admitting: General Surgery

## 2012-09-13 ENCOUNTER — Encounter (INDEPENDENT_AMBULATORY_CARE_PROVIDER_SITE_OTHER): Payer: BC Managed Care – PPO | Admitting: General Surgery

## 2012-10-02 ENCOUNTER — Encounter (INDEPENDENT_AMBULATORY_CARE_PROVIDER_SITE_OTHER): Payer: Self-pay | Admitting: General Surgery

## 2012-10-02 ENCOUNTER — Encounter (INDEPENDENT_AMBULATORY_CARE_PROVIDER_SITE_OTHER): Payer: Self-pay

## 2012-10-02 ENCOUNTER — Ambulatory Visit (INDEPENDENT_AMBULATORY_CARE_PROVIDER_SITE_OTHER): Payer: BC Managed Care – PPO | Admitting: General Surgery

## 2012-10-02 VITALS — BP 102/60 | HR 60 | Temp 98.7°F | Resp 14 | Ht 64.5 in | Wt 106.2 lb

## 2012-10-02 DIAGNOSIS — Z9889 Other specified postprocedural states: Secondary | ICD-10-CM

## 2012-10-02 NOTE — Patient Instructions (Signed)
Resume normal activities as tolerated, as we discussed. 

## 2012-10-02 NOTE — Progress Notes (Signed)
Procedure:  Repair of ventral and umbilical hernias.  Date:  08/21/2012  Pathology:  na History:  She is here for her first postoperative visit and is doing well. She went back to work today.  Exam: General- Is in NAD. Abdomen-soft, epigastric and umbilical incisions clean and intact.  Hernia repairs are solid.  Assessment:  Doing well postoperatively the  Plan:  Resume normal activities as tolerated, and we discussed what this means. Return visit as needed.

## 2012-12-15 ENCOUNTER — Other Ambulatory Visit: Payer: Self-pay

## 2012-12-15 DIAGNOSIS — Z1231 Encounter for screening mammogram for malignant neoplasm of breast: Secondary | ICD-10-CM

## 2012-12-28 ENCOUNTER — Ambulatory Visit
Admission: RE | Admit: 2012-12-28 | Discharge: 2012-12-28 | Disposition: A | Discharge status: Home / Self Care | Payer: BC Managed Care – PPO | Source: Ambulatory Visit

## 2012-12-28 DIAGNOSIS — Z1231 Encounter for screening mammogram for malignant neoplasm of breast: Secondary | ICD-10-CM

## 2013-01-24 ENCOUNTER — Telehealth (INDEPENDENT_AMBULATORY_CARE_PROVIDER_SITE_OTHER): Payer: Self-pay | Admitting: *Deleted

## 2013-01-24 NOTE — Telephone Encounter (Signed)
Patient called to state that she has been having a pain on her right side since her surgery back in December 2013.  Patient states nothing makes the pain better or worse.  Patient also states a "mass" on the right side as well which is the site of the pain.  Spoke to [place] who advised to schedule patient for next available established patient new problem slot so patient can come in to be seen by Abbey Chatters MD.  Patient made aware of appt 6/13.  Patient states that if pain gets too bad then she will go to the ED.  Advised patient that we do have surgeons at both [facility] and [facility] so if they ED MD feels that a surgeon needs to be emergently consulted we can be at either of the hospitals, patient states understanding and agreeable at this time.

## 2013-02-16 ENCOUNTER — Encounter (INDEPENDENT_AMBULATORY_CARE_PROVIDER_SITE_OTHER): Payer: BC Managed Care – PPO | Admitting: General Surgery

## 2013-03-23 ENCOUNTER — Encounter (INDEPENDENT_AMBULATORY_CARE_PROVIDER_SITE_OTHER): Payer: BC Managed Care – PPO | Admitting: General Surgery

## 2014-12-20 ENCOUNTER — Other Ambulatory Visit: Payer: Self-pay

## 2014-12-20 DIAGNOSIS — Z9889 Other specified postprocedural states: Secondary | ICD-10-CM

## 2014-12-20 DIAGNOSIS — Z1231 Encounter for screening mammogram for malignant neoplasm of breast: Secondary | ICD-10-CM

## 2014-12-26 ENCOUNTER — Encounter (INDEPENDENT_AMBULATORY_CARE_PROVIDER_SITE_OTHER): Payer: Self-pay

## 2014-12-26 ENCOUNTER — Ambulatory Visit
Admission: RE | Admit: 2014-12-26 | Discharge: 2014-12-26 | Disposition: A | Discharge status: Home / Self Care | Payer: 59 | Source: Ambulatory Visit

## 2014-12-26 DIAGNOSIS — Z1231 Encounter for screening mammogram for malignant neoplasm of breast: Secondary | ICD-10-CM

## 2014-12-26 DIAGNOSIS — Z9889 Other specified postprocedural states: Secondary | ICD-10-CM

## 2016-05-12 ENCOUNTER — Other Ambulatory Visit: Payer: Self-pay | Admitting: Family Medicine

## 2016-05-12 DIAGNOSIS — Z1231 Encounter for screening mammogram for malignant neoplasm of breast: Secondary | ICD-10-CM

## 2016-05-20 ENCOUNTER — Ambulatory Visit
Admission: RE | Admit: 2016-05-20 | Discharge: 2016-05-20 | Disposition: A | Discharge status: Home / Self Care | Payer: 59 | Source: Ambulatory Visit | Attending: Family Medicine | Admitting: Family Medicine

## 2016-05-20 DIAGNOSIS — Z1231 Encounter for screening mammogram for malignant neoplasm of breast: Secondary | ICD-10-CM

## 2016-12-22 DIAGNOSIS — L821 Other seborrheic keratosis: Secondary | ICD-10-CM | POA: Diagnosis not present

## 2017-02-15 DIAGNOSIS — L989 Disorder of the skin and subcutaneous tissue, unspecified: Secondary | ICD-10-CM | POA: Diagnosis not present

## 2017-03-07 DIAGNOSIS — Z803 Family history of malignant neoplasm of breast: Secondary | ICD-10-CM | POA: Diagnosis not present

## 2017-03-21 DIAGNOSIS — Z803 Family history of malignant neoplasm of breast: Secondary | ICD-10-CM | POA: Diagnosis not present

## 2017-04-12 DIAGNOSIS — Z803 Family history of malignant neoplasm of breast: Secondary | ICD-10-CM | POA: Diagnosis not present

## 2017-05-20 DIAGNOSIS — Z803 Family history of malignant neoplasm of breast: Secondary | ICD-10-CM | POA: Diagnosis not present

## 2017-06-09 DIAGNOSIS — Z803 Family history of malignant neoplasm of breast: Secondary | ICD-10-CM | POA: Diagnosis not present

## 2017-06-10 ENCOUNTER — Other Ambulatory Visit: Payer: Self-pay | Admitting: Family Medicine

## 2017-06-10 DIAGNOSIS — Z1231 Encounter for screening mammogram for malignant neoplasm of breast: Secondary | ICD-10-CM

## 2017-06-13 ENCOUNTER — Ambulatory Visit
Admission: RE | Admit: 2017-06-13 | Discharge: 2017-06-13 | Disposition: A | Discharge status: Home / Self Care | Payer: 59 | Source: Ambulatory Visit | Attending: Family Medicine | Admitting: Family Medicine

## 2017-06-13 DIAGNOSIS — Z1231 Encounter for screening mammogram for malignant neoplasm of breast: Secondary | ICD-10-CM | POA: Diagnosis not present

## 2017-07-12 DIAGNOSIS — Z0000 Encounter for general adult medical examination without abnormal findings: Secondary | ICD-10-CM | POA: Diagnosis not present

## 2017-07-12 DIAGNOSIS — E7800 Pure hypercholesterolemia, unspecified: Secondary | ICD-10-CM | POA: Diagnosis not present

## 2017-07-20 DIAGNOSIS — Z0000 Encounter for general adult medical examination without abnormal findings: Secondary | ICD-10-CM | POA: Diagnosis not present

## 2017-07-20 DIAGNOSIS — E7800 Pure hypercholesterolemia, unspecified: Secondary | ICD-10-CM | POA: Diagnosis not present

## 2017-08-02 DIAGNOSIS — R05 Cough: Secondary | ICD-10-CM | POA: Diagnosis not present

## 2017-08-02 DIAGNOSIS — J040 Acute laryngitis: Secondary | ICD-10-CM | POA: Diagnosis not present

## 2017-08-02 DIAGNOSIS — R0602 Shortness of breath: Secondary | ICD-10-CM | POA: Diagnosis not present

## 2018-05-09 DIAGNOSIS — R0981 Nasal congestion: Secondary | ICD-10-CM | POA: Diagnosis not present

## 2018-06-13 ENCOUNTER — Other Ambulatory Visit: Payer: Self-pay | Admitting: Family Medicine

## 2018-06-13 DIAGNOSIS — Z1231 Encounter for screening mammogram for malignant neoplasm of breast: Secondary | ICD-10-CM

## 2018-07-13 ENCOUNTER — Ambulatory Visit
Admission: RE | Admit: 2018-07-13 | Discharge: 2018-07-13 | Disposition: A | Discharge status: Home / Self Care | Payer: 59 | Source: Ambulatory Visit | Attending: Family Medicine | Admitting: Family Medicine

## 2018-07-13 DIAGNOSIS — Z1231 Encounter for screening mammogram for malignant neoplasm of breast: Secondary | ICD-10-CM

## 2018-07-18 DIAGNOSIS — E7800 Pure hypercholesterolemia, unspecified: Secondary | ICD-10-CM | POA: Diagnosis not present

## 2018-07-18 DIAGNOSIS — Z0000 Encounter for general adult medical examination without abnormal findings: Secondary | ICD-10-CM | POA: Diagnosis not present

## 2019-10-02 DIAGNOSIS — L03011 Cellulitis of right finger: Secondary | ICD-10-CM | POA: Diagnosis not present

## 2019-11-13 DIAGNOSIS — L301 Dyshidrosis [pompholyx]: Secondary | ICD-10-CM | POA: Diagnosis not present

## 2020-01-30 DIAGNOSIS — E7800 Pure hypercholesterolemia, unspecified: Secondary | ICD-10-CM | POA: Diagnosis not present

## 2020-02-12 DIAGNOSIS — D649 Anemia, unspecified: Secondary | ICD-10-CM | POA: Diagnosis not present

## 2020-02-12 DIAGNOSIS — R7401 Elevation of levels of liver transaminase levels: Secondary | ICD-10-CM | POA: Diagnosis not present

## 2020-02-29 DIAGNOSIS — D649 Anemia, unspecified: Secondary | ICD-10-CM | POA: Diagnosis not present

## 2020-02-29 DIAGNOSIS — E7800 Pure hypercholesterolemia, unspecified: Secondary | ICD-10-CM | POA: Diagnosis not present

## 2020-02-29 DIAGNOSIS — R7401 Elevation of levels of liver transaminase levels: Secondary | ICD-10-CM | POA: Diagnosis not present

## 2020-02-29 DIAGNOSIS — Z1211 Encounter for screening for malignant neoplasm of colon: Secondary | ICD-10-CM | POA: Diagnosis not present

## 2020-03-03 ENCOUNTER — Other Ambulatory Visit: Payer: Self-pay | Admitting: Family Medicine

## 2020-03-03 DIAGNOSIS — R7989 Other specified abnormal findings of blood chemistry: Secondary | ICD-10-CM

## 2020-03-03 DIAGNOSIS — R7401 Elevation of levels of liver transaminase levels: Secondary | ICD-10-CM

## 2020-03-07 ENCOUNTER — Ambulatory Visit
Admission: RE | Admit: 2020-03-07 | Discharge: 2020-03-07 | Disposition: A | Discharge status: Home / Self Care | Payer: 59 | Source: Ambulatory Visit | Attending: Family Medicine | Admitting: Family Medicine

## 2020-03-07 DIAGNOSIS — R7401 Elevation of levels of liver transaminase levels: Secondary | ICD-10-CM

## 2020-03-07 DIAGNOSIS — R7989 Other specified abnormal findings of blood chemistry: Secondary | ICD-10-CM

## 2020-04-17 DIAGNOSIS — E7800 Pure hypercholesterolemia, unspecified: Secondary | ICD-10-CM | POA: Diagnosis not present

## 2020-04-17 DIAGNOSIS — D649 Anemia, unspecified: Secondary | ICD-10-CM | POA: Diagnosis not present

## 2020-04-18 ENCOUNTER — Other Ambulatory Visit: Payer: Self-pay | Admitting: Family Medicine

## 2020-04-18 DIAGNOSIS — Z1231 Encounter for screening mammogram for malignant neoplasm of breast: Secondary | ICD-10-CM

## 2020-04-30 ENCOUNTER — Ambulatory Visit
Admission: RE | Admit: 2020-04-30 | Discharge: 2020-04-30 | Disposition: A | Discharge status: Home / Self Care | Payer: BC Managed Care – PPO | Source: Ambulatory Visit | Attending: Family Medicine | Admitting: Family Medicine

## 2020-04-30 ENCOUNTER — Other Ambulatory Visit: Payer: Self-pay

## 2020-04-30 DIAGNOSIS — Z1231 Encounter for screening mammogram for malignant neoplasm of breast: Secondary | ICD-10-CM | POA: Diagnosis not present

## 2020-06-26 DIAGNOSIS — E7800 Pure hypercholesterolemia, unspecified: Secondary | ICD-10-CM | POA: Diagnosis not present

## 2020-06-26 DIAGNOSIS — D649 Anemia, unspecified: Secondary | ICD-10-CM | POA: Diagnosis not present

## 2020-06-26 DIAGNOSIS — Z23 Encounter for immunization: Secondary | ICD-10-CM | POA: Diagnosis not present

## 2020-06-26 DIAGNOSIS — Z0000 Encounter for general adult medical examination without abnormal findings: Secondary | ICD-10-CM | POA: Diagnosis not present

## 2020-06-27 DIAGNOSIS — Z1211 Encounter for screening for malignant neoplasm of colon: Secondary | ICD-10-CM | POA: Diagnosis not present

## 2020-12-08 DIAGNOSIS — S46812A Strain of other muscles, fascia and tendons at shoulder and upper arm level, left arm, initial encounter: Secondary | ICD-10-CM | POA: Diagnosis not present

## 2021-06-02 ENCOUNTER — Other Ambulatory Visit: Payer: Self-pay | Admitting: Family Medicine

## 2021-06-02 DIAGNOSIS — Z1231 Encounter for screening mammogram for malignant neoplasm of breast: Secondary | ICD-10-CM

## 2021-06-29 ENCOUNTER — Ambulatory Visit
Admission: RE | Admit: 2021-06-29 | Discharge: 2021-06-29 | Disposition: A | Discharge status: Home / Self Care | Payer: BC Managed Care – PPO | Source: Ambulatory Visit | Attending: Family Medicine | Admitting: Family Medicine

## 2021-06-29 ENCOUNTER — Other Ambulatory Visit: Payer: Self-pay

## 2021-06-29 DIAGNOSIS — Z1231 Encounter for screening mammogram for malignant neoplasm of breast: Secondary | ICD-10-CM

## 2021-06-30 DIAGNOSIS — Z0000 Encounter for general adult medical examination without abnormal findings: Secondary | ICD-10-CM | POA: Diagnosis not present

## 2021-06-30 DIAGNOSIS — E7800 Pure hypercholesterolemia, unspecified: Secondary | ICD-10-CM | POA: Diagnosis not present

## 2021-06-30 DIAGNOSIS — D649 Anemia, unspecified: Secondary | ICD-10-CM | POA: Diagnosis not present

## 2021-07-01 DIAGNOSIS — E7800 Pure hypercholesterolemia, unspecified: Secondary | ICD-10-CM | POA: Diagnosis not present

## 2022-02-05 ENCOUNTER — Other Ambulatory Visit: Payer: Self-pay | Admitting: Family Medicine

## 2022-02-05 DIAGNOSIS — Z1231 Encounter for screening mammogram for malignant neoplasm of breast: Secondary | ICD-10-CM

## 2022-02-21 IMAGING — MG MM DIGITAL SCREENING BILAT W/ TOMO AND CAD
6 of 10 series · 6 of 30 positions shown · non-contrast
Comparison: Previous exam(s).

CLINICAL DATA: Screening.

EXAM:
DIGITAL SCREENING BILATERAL MAMMOGRAM WITH TOMOSYNTHESIS AND CAD
TECHNIQUE: Bilateral screening digital craniocaudal and mediolateral oblique
mammograms were obtained. Bilateral screening digital breast
tomosynthesis was performed. The images were evaluated with
computer-aided detection.

[L MLO synth-2D]
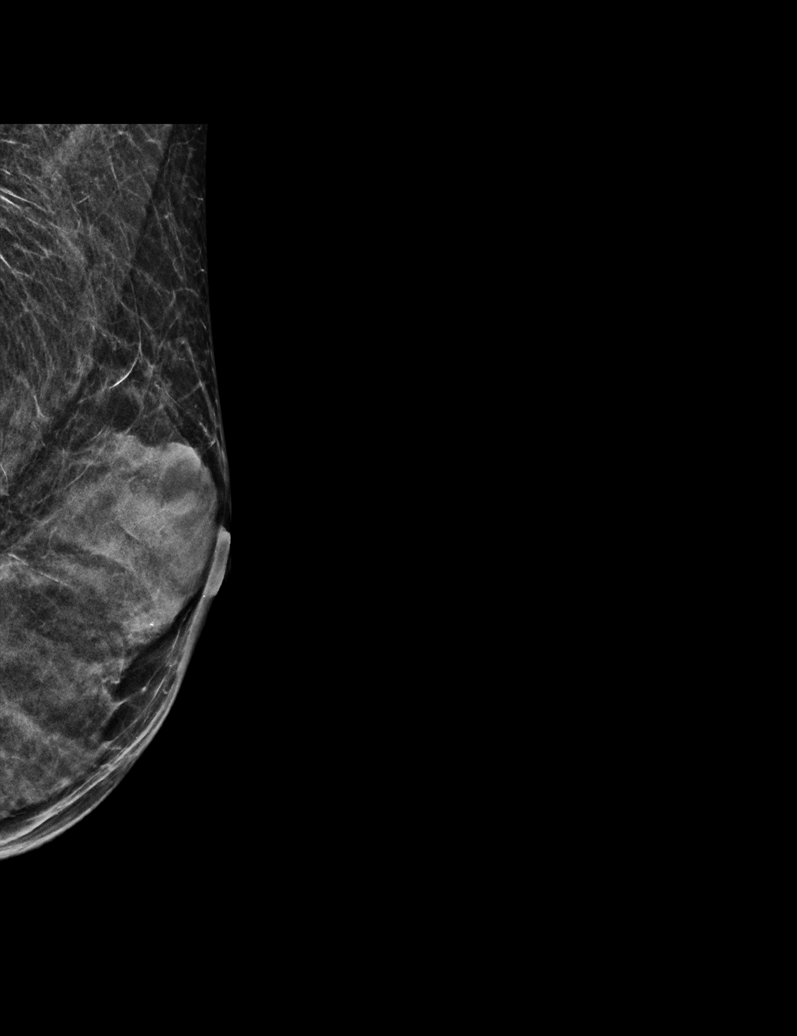

[R CC synth-2D (1 of 2)]
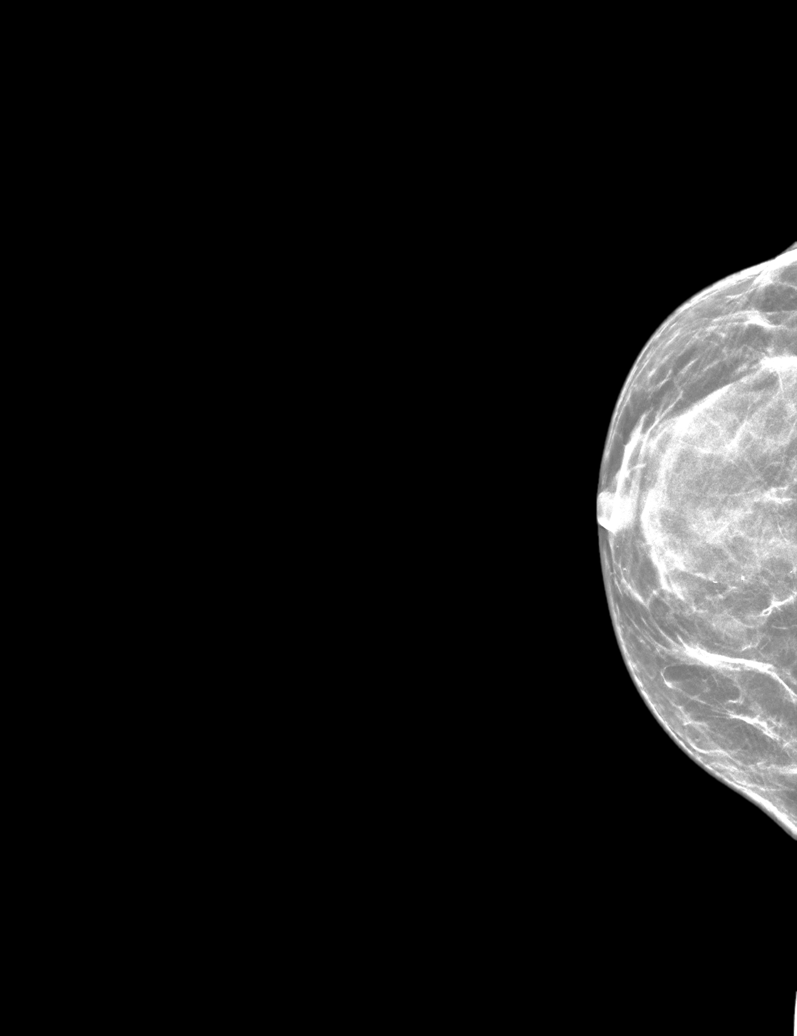

[L CC synth-2D]
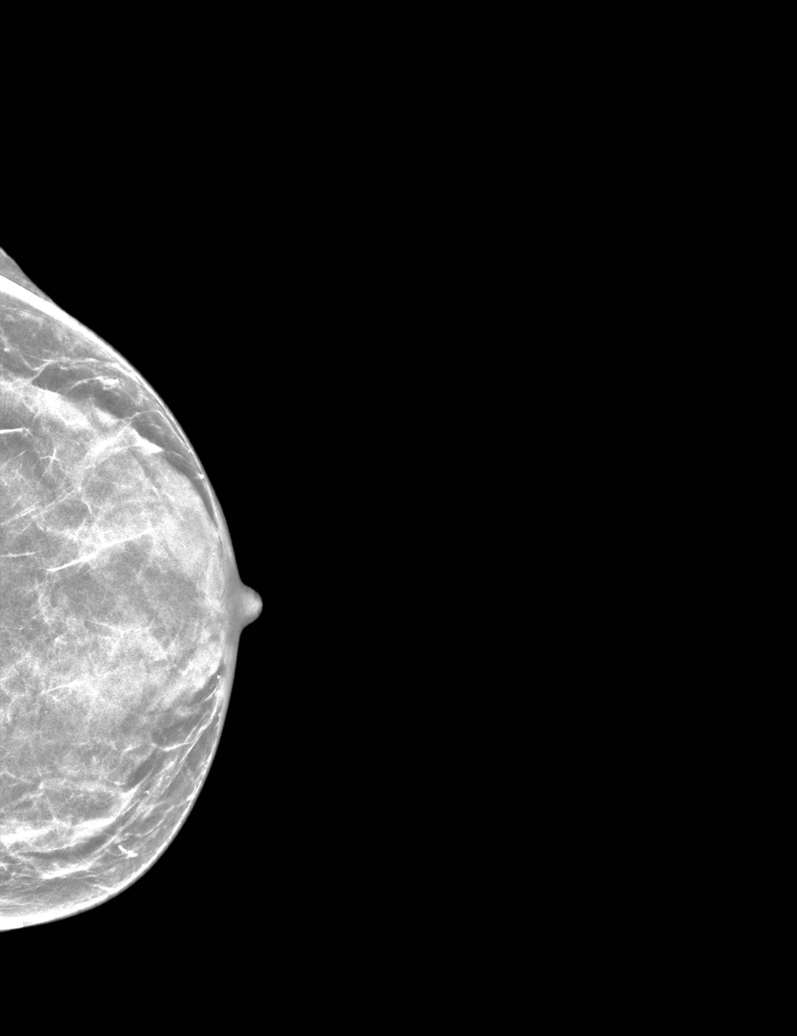

[R MLO synth-2D]
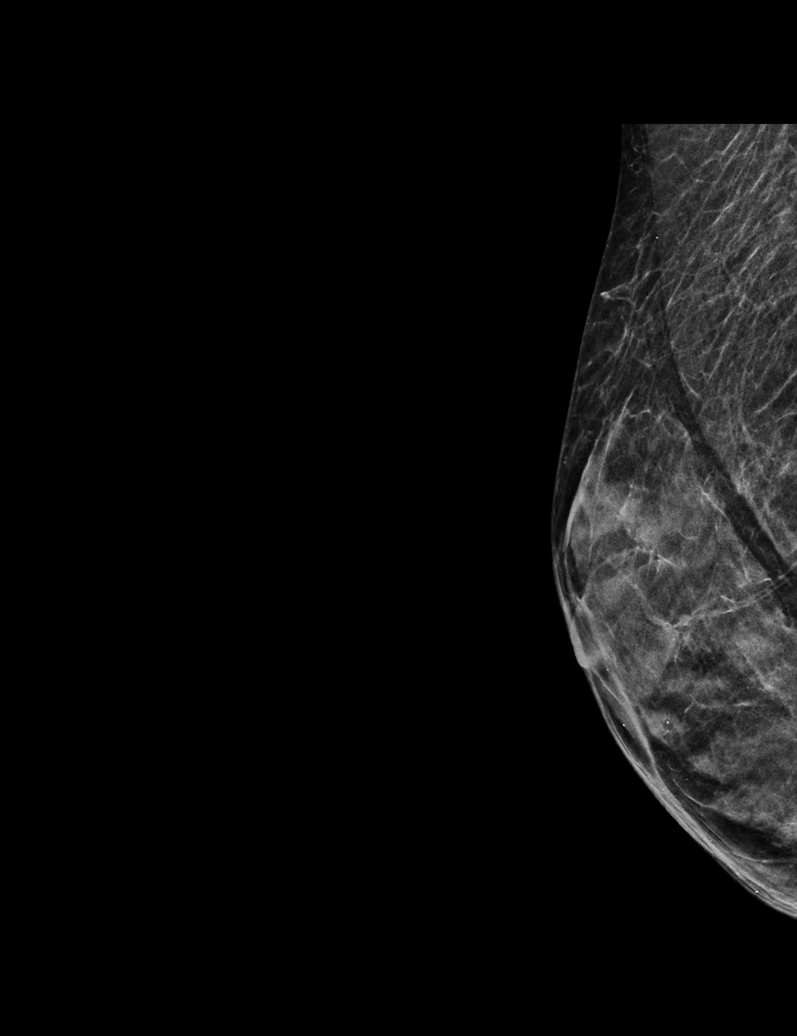

[R CC synth-2D (2 of 2)]
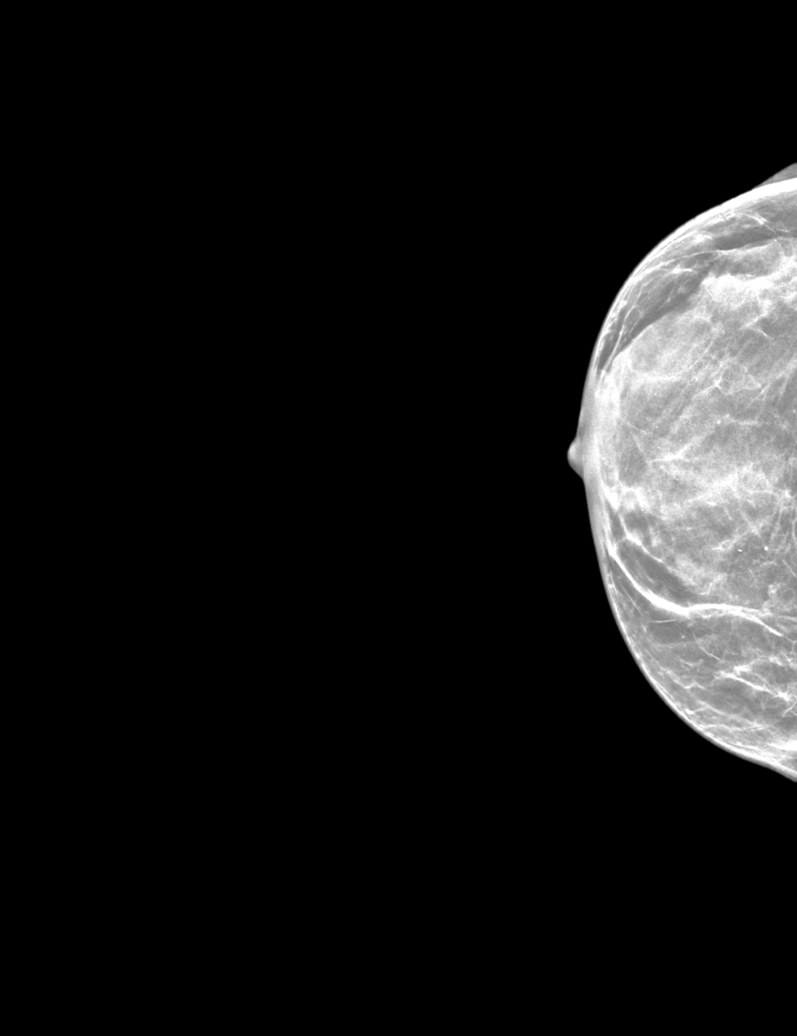

[L CC tomo · tomo slice 19/38.0]
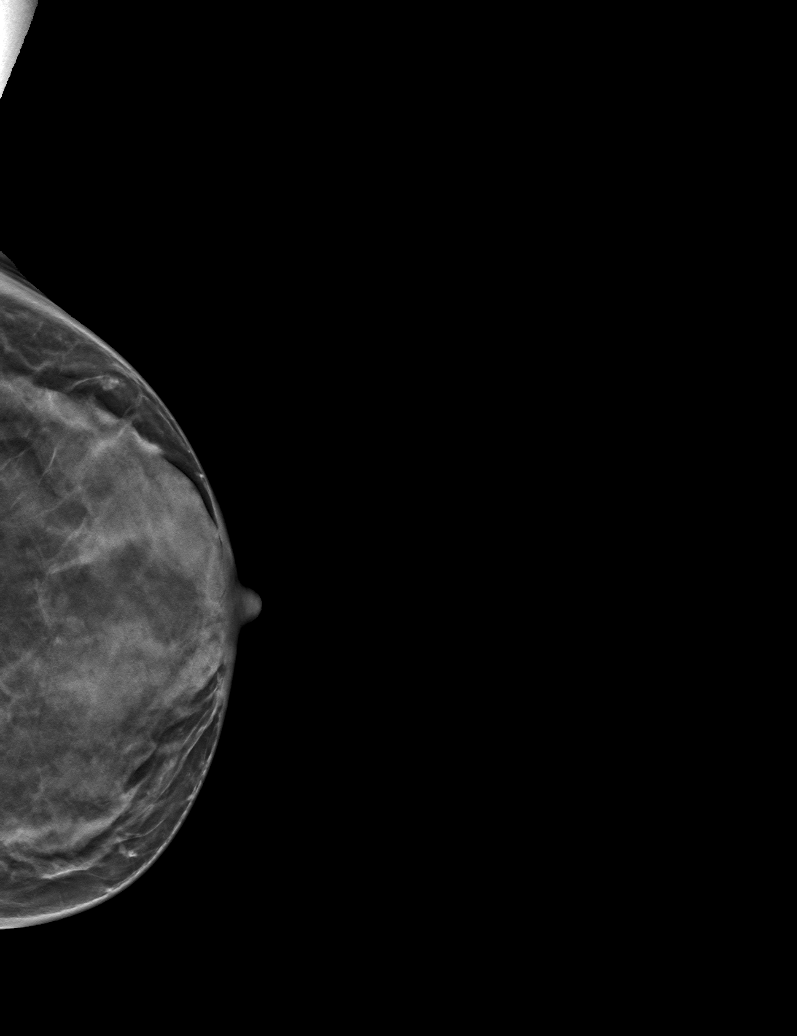

[6 of 30 positions shown; findings below may reference images not displayed]

ACR Breast Density Category c: The breast tissue is heterogeneously
dense, which may obscure small masses.
FINDINGS: There are no findings suspicious for malignancy.
IMPRESSION: No mammographic evidence of malignancy. A result letter of this
screening mammogram will be mailed directly to the patient.

RECOMMENDATION:
Screening mammogram in one year. (Code:[id number])

BI-RADS CATEGORY  1: Negative.

## 2022-06-30 ENCOUNTER — Ambulatory Visit
Admission: RE | Admit: 2022-06-30 | Discharge: 2022-06-30 | Disposition: A | Discharge status: Home / Self Care | Payer: Federal, State, Local not specified - PPO | Source: Ambulatory Visit | Attending: Family Medicine | Admitting: Family Medicine

## 2022-06-30 DIAGNOSIS — Z1231 Encounter for screening mammogram for malignant neoplasm of breast: Secondary | ICD-10-CM | POA: Diagnosis not present

## 2022-07-01 DIAGNOSIS — Z0001 Encounter for general adult medical examination with abnormal findings: Secondary | ICD-10-CM | POA: Diagnosis not present

## 2022-07-01 DIAGNOSIS — E7800 Pure hypercholesterolemia, unspecified: Secondary | ICD-10-CM | POA: Diagnosis not present

## 2022-07-01 DIAGNOSIS — Z1211 Encounter for screening for malignant neoplasm of colon: Secondary | ICD-10-CM | POA: Diagnosis not present

## 2022-08-04 DIAGNOSIS — K429 Umbilical hernia without obstruction or gangrene: Secondary | ICD-10-CM | POA: Diagnosis not present

## 2022-09-09 DIAGNOSIS — K432 Incisional hernia without obstruction or gangrene: Secondary | ICD-10-CM | POA: Diagnosis not present

## 2022-09-13 ENCOUNTER — Other Ambulatory Visit: Payer: Self-pay | Admitting: Surgery

## 2022-09-13 DIAGNOSIS — K429 Umbilical hernia without obstruction or gangrene: Secondary | ICD-10-CM

## 2022-09-16 ENCOUNTER — Ambulatory Visit
Admission: RE | Admit: 2022-09-16 | Discharge: 2022-09-16 | Disposition: A | Discharge status: Home / Self Care | Payer: Federal, State, Local not specified - PPO | Source: Ambulatory Visit | Attending: Surgery | Admitting: Surgery

## 2022-09-16 DIAGNOSIS — K429 Umbilical hernia without obstruction or gangrene: Secondary | ICD-10-CM

## 2022-09-16 DIAGNOSIS — K439 Ventral hernia without obstruction or gangrene: Secondary | ICD-10-CM | POA: Diagnosis not present

## 2022-09-16 MED ORDER — IOPAMIDOL (ISOVUE-300) INJECTION 61%: 100.0000 mL | Freq: Once | INTRAVENOUS | Status: DC | PRN

## 2022-09-16 MED ORDER — IOPAMIDOL (ISOVUE-300) INJECTION 61%
70.0000 mL | Freq: Once | INTRAVENOUS | Status: AC | PRN
  Administered 2022-09-16: 70 mL via INTRAVENOUS

## 2022-09-28 DIAGNOSIS — K469 Unspecified abdominal hernia without obstruction or gangrene: Secondary | ICD-10-CM | POA: Diagnosis not present

## 2022-10-11 ENCOUNTER — Other Ambulatory Visit: Payer: Self-pay | Admitting: Family Medicine

## 2022-10-11 DIAGNOSIS — K429 Umbilical hernia without obstruction or gangrene: Secondary | ICD-10-CM

## 2022-10-18 ENCOUNTER — Ambulatory Visit
Admission: RE | Admit: 2022-10-18 | Discharge: 2022-10-18 | Disposition: A | Discharge status: Home / Self Care | Payer: Federal, State, Local not specified - PPO | Source: Ambulatory Visit | Attending: Family Medicine | Admitting: Family Medicine

## 2022-10-18 DIAGNOSIS — K429 Umbilical hernia without obstruction or gangrene: Secondary | ICD-10-CM

## 2022-11-01 ENCOUNTER — Ambulatory Visit: Payer: Federal, State, Local not specified - PPO | Admitting: Surgery

## 2022-11-01 ENCOUNTER — Encounter: Payer: Self-pay | Admitting: Surgery

## 2022-11-01 VITALS — BP 112/71 | HR 66 | Temp 97.9°F | Ht 64.0 in | Wt 108.4 lb

## 2022-11-01 DIAGNOSIS — R1033 Periumbilical pain: Secondary | ICD-10-CM

## 2022-11-01 NOTE — Progress Notes (Signed)
11/01/2022  Reason for Visit:  Umbilical pain  Requesting Provider:  Lurline Del, DO  History of Present Illness: Whitney Le is a 57 y.o. female presenting for evaluation of umbilical pain and concern for a possible hernia recurrence.  The patient had an open umbilical and epigastric hernia repair with onlay mesh in 2013.  She reports that since surgery she's always had some sensitivity at the umbilicus and describes tingling or pinching sensation there.  About two months ago, she noticed something that was trying to poke at the umbilicus skin.  She reports feeling something pointy towards the inferior portion of the umbilicus.  She was worried about possible mesh erosion and was referred to [facility].  She saw Dr. Thermon Leyland last month and a CT scan was ordered to evaluate.  There was no hernia recurrence noted on CT images, although in the epigastric area there is a small opening in the fascia, though still covered by the mesh.  Ultrasound was also obtained which did not show any hernia recurrence on either side.  The patient reports that she was told she would need another surgery to excise the mesh and repair the hernias again.  Today, she reports that area at the umbilicus is doing better and denies any worsening pain, denies any drainage or skin breakdown.  Denies any bulging sensation.  Past Medical History: Past Medical History:  Diagnosis Date   Allergy    Anemia    Cervical polyp    Fibroid uterus    Menorrhagia    Umbilical hernia    Vitamin D deficiency      Past Surgical History: Past Surgical History:  Procedure Laterality Date   ABDOMINAL HYSTERECTOMY  04/07/2012   [facility]   BREAST REDUCTION SURGERY     BUNIONECTOMY  2002   BUNIONECTOMY  2002   bilateral   EXTERNAL EAR SURGERY     left ear   HERNIA REPAIR  08/21/12   incisional and umb hernia repaired w/ mesh by Dr. Tennis Ship EAR SURGERY     REDUCTION MAMMAPLASTY Bilateral 1994     Home Medications: Prior to Admission medications   Not on File    Allergies: Allergies  Allergen Reactions   Honey Bee Treatment [Bee Venom] Nausea And Vomiting    States HONEY IT SELF   Penicillins Hives    Social History:  reports that she has never smoked. She has never used smokeless tobacco. She reports that she does not drink alcohol and does not use drugs.   Family History: Family History  Problem Relation Age of Onset   Cancer Mother        breast   Breast cancer Mother 55   Cancer Sister        breast   Breast cancer Sister 43    Review of Systems: Review of Systems  Constitutional:  Negative for chills and fever.  Respiratory:  Negative for shortness of breath.   Cardiovascular:  Negative for chest pain.  Gastrointestinal:  Negative for abdominal pain, nausea and vomiting.  Skin:  Negative for rash.       Possible protrusion at the umbilical site    Physical Exam BP 112/71   Pulse 66   Temp 97.9 F (36.6 C) (Oral)   Ht '5\' 4"'$  (1.626 m)   Wt 108 lb 6.4 oz (49.2 kg)   LMP 04/02/2012   SpO2 97%   BMI 18.61 kg/m  CONSTITUTIONAL: No acute distress. HEENT:  Normocephalic,  atraumatic, extraocular motion intact.  RESPIRATORY:  Lungs are clear, and breath sounds are equal bilaterally. Normal respiratory effort without pathologic use of accessory muscles. CARDIOVASCULAR: Heart is regular without murmurs, gallops, or rubs. GI: The abdomen is soft, non-distended, non-tender to palpation.  The epigastric incision is well healed, without any evidence of hernia recurrence on valsalva or coughing.  There is palpable firmness throughout the incision consistent with scar tissue.  At the umbilicus, there is some dryness of the skin, and also palpable point firmness areas consistent with scar tissue and the mesh, possibly the sutures, but the skin itself is intact without any wounds or breakdown.  No evidence of hernia recurrence on valsalva or coughing.   MUSCULOSKELETAL:  Normal muscle strength and tone in all four extremities.  No peripheral edema or cyanosis. NEUROLOGIC:  Motor and sensation is grossly normal.  Cranial nerves are grossly intact.  Laboratory Analysis: Labs from 07/01/22: Na 142, K 5.2, Cl 106, CO2 30, BUN 15, Cr 0.98.  Total bili 1.2, AST 28, ALT 31, Alk Phos 50, albumin 4.3.  WBC 3.1, Hgb 13.1, Hct 40.2, Plt 243.  Imaging: CT abdomen/pelvis on 09/16/22: IMPRESSION: 1. Prior ventral abdominal hernia repair without evidence of local hernia recurrence. If concern for hernia recurrence consider further evaluation by dedicated abdominal ultrasound with and without provocative maneuvers. 2. Large volume of formed stool in the colon.  Ultrasound abdomen 10/18/22: IMPRESSION: No sonographic evidence for recurrent hernia at the sites of previous hernia repair. Imaging both sites was performed before and during Valsalva maneuver in an attempt to express a potentially occult fascial defect.  Assessment and Plan: This is a 57 y.o. female with umbilical pain.  --Discussed with the patient the findings on the CT scan and ultrasound.  There is no evidence of hernia recurrence on either site or either imaging.  I have personally viewed the images.  In the epigastric site, it may be that the fascial edges are separated, but the mesh is overlapping the site well and is scarred down well that no palpable bulging is noted on exam.  At the umbilicus, there is no defect.  She is very skinny and I think that may be partially why she can feel the scar tissue around the sutures and mesh so well and it may be why the umbilical area is more sensitive.  Reassured her that there is no hernia recurrence or any evidence of skin breakdown that would require surgery.  Recommended that she apply moisturizing lotion over the umbilical area where the skin is more dry to help nourish it.  Otherwise no activity or diet restrictions. --Return precautions  given.  Follow up as needed.  I spent 30 minutes dedicated to the care of this patient on the date of this encounter to include pre-visit review of records, face-to-face time with the patient discussing diagnosis and management, and any post-visit coordination of care.   Melvyn Neth, [facility]

## 2022-11-01 NOTE — Patient Instructions (Signed)
If you have any concerns or questions, please feel free to call our office.   Umbilical Hernia, Adult  A hernia is a bulge of tissue that pushes through an opening between muscles. An umbilical hernia happens in the abdomen, near the belly button (umbilicus). The hernia may contain tissues from the small intestine, large intestine, or fatty tissue covering the intestines. Umbilical hernias in adults tend to get worse over time, and they require surgical treatment. There are different types of umbilical hernias, including: Indirect hernia. This type is located just above or below the umbilicus. It is the most common type of umbilical hernia in adults. Direct hernia. This type forms through an opening formed by the umbilicus. Reducible hernia. This type of hernia comes and goes. It may be visible only when you strain, lift something heavy, or cough. This type of hernia can be pushed back into the abdomen (reduced). Incarcerated hernia. This type traps abdominal tissue inside the hernia. This type of hernia cannot be reduced. Strangulated hernia. This type of hernia cuts off blood flow to the tissues inside the hernia. The tissues can start to die if this happens. This type of hernia requires emergency treatment. What are the causes? An umbilical hernia happens when tissue inside the abdomen presses on a weak area of the abdominal muscles. What increases the risk? You may have a greater risk of this condition if you: Are obese. Have had several pregnancies. Have a buildup of fluid inside your abdomen. Have had surgery that weakens the abdominal muscles. What are the signs or symptoms? The main symptom of this condition is a painless bulge at or near the belly button. A reducible hernia may be visible only when you strain, lift something heavy, or cough. Other symptoms may include: Dull pain. A feeling of pressure. Symptoms of a strangulated hernia may include: Pain that gets increasingly  worse. Nausea and vomiting. Pain when pressing on the hernia. Skin over the hernia becoming red or purple. Constipation. Blood in the stool. How is this diagnosed? This condition may be diagnosed based on: A physical exam. You may be asked to cough or strain while standing. These actions increase the pressure inside your abdomen and can force the hernia through the opening in your muscles. Your health care provider may try to reduce the hernia by pressing on it. Your symptoms and medical history. How is this treated? Surgery is the only treatment for an umbilical hernia. Surgery for a strangulated hernia is done as soon as possible. If you have a small hernia that is not incarcerated, you may need to lose weight before having surgery. Follow these instructions at home: Lose weight, if told by your health care provider. Do not try to push the hernia back in. Watch your hernia for any changes in color or size. Tell your health care provider if any changes occur. You may need to avoid activities that increase pressure on your hernia. Do not lift anything that is heavier than 10 lb (4.5 kg), or the limit that you are told, until your health care provider says that it is safe. Take over-the-counter and prescription medicines only as told by your health care provider. Keep all follow-up visits. This is important. Contact a health care provider if: Your hernia gets larger. Your hernia becomes painful. Get help right away if: You develop sudden, severe pain near the area of your hernia. You have pain as well as nausea or vomiting. You have pain and the skin over your  hernia changes color. You develop a fever or chills. Summary A hernia is a bulge of tissue that pushes through an opening between muscles. An umbilical hernia happens near the belly button. Surgery is the only treatment for an umbilical hernia. Do not try to push your hernia back in. Keep all follow-up visits. This is  important. This information is not intended to replace advice given to you by your health care provider. Make sure you discuss any questions you have with your health care provider. Document Revised: 03/31/2020 Document Reviewed: 03/31/2020 Elsevier Patient Education  [place].

## 2023-01-07 DIAGNOSIS — Z1211 Encounter for screening for malignant neoplasm of colon: Secondary | ICD-10-CM | POA: Diagnosis not present

## 2023-01-07 DIAGNOSIS — D120 Benign neoplasm of cecum: Secondary | ICD-10-CM | POA: Diagnosis not present

## 2023-01-07 DIAGNOSIS — K648 Other hemorrhoids: Secondary | ICD-10-CM | POA: Diagnosis not present

## 2023-01-07 DIAGNOSIS — D122 Benign neoplasm of ascending colon: Secondary | ICD-10-CM | POA: Diagnosis not present

## 2023-05-20 DIAGNOSIS — Z03818 Encounter for observation for suspected exposure to other biological agents ruled out: Secondary | ICD-10-CM | POA: Diagnosis not present

## 2023-05-20 DIAGNOSIS — Z681 Body mass index (BMI) 19 or less, adult: Secondary | ICD-10-CM | POA: Diagnosis not present

## 2023-05-20 DIAGNOSIS — R52 Pain, unspecified: Secondary | ICD-10-CM | POA: Diagnosis not present

## 2023-05-20 DIAGNOSIS — R519 Headache, unspecified: Secondary | ICD-10-CM | POA: Diagnosis not present

## 2023-05-20 DIAGNOSIS — E7800 Pure hypercholesterolemia, unspecified: Secondary | ICD-10-CM | POA: Diagnosis not present

## 2023-07-06 ENCOUNTER — Other Ambulatory Visit: Payer: Self-pay | Admitting: Family Medicine

## 2023-07-06 DIAGNOSIS — Z1231 Encounter for screening mammogram for malignant neoplasm of breast: Secondary | ICD-10-CM

## 2023-07-07 ENCOUNTER — Other Ambulatory Visit (HOSPITAL_BASED_OUTPATIENT_CLINIC_OR_DEPARTMENT_OTHER): Payer: Self-pay | Admitting: Family Medicine

## 2023-07-07 DIAGNOSIS — E7800 Pure hypercholesterolemia, unspecified: Secondary | ICD-10-CM

## 2023-07-07 DIAGNOSIS — R7401 Elevation of levels of liver transaminase levels: Secondary | ICD-10-CM | POA: Diagnosis not present

## 2023-07-07 DIAGNOSIS — Z0001 Encounter for general adult medical examination with abnormal findings: Secondary | ICD-10-CM | POA: Diagnosis not present

## 2023-07-11 ENCOUNTER — Ambulatory Visit
Admission: RE | Admit: 2023-07-11 | Discharge: 2023-07-11 | Disposition: A | Discharge status: Home / Self Care | Payer: Federal, State, Local not specified - PPO | Source: Ambulatory Visit | Attending: Family Medicine | Admitting: Family Medicine

## 2023-07-11 DIAGNOSIS — Z1231 Encounter for screening mammogram for malignant neoplasm of breast: Secondary | ICD-10-CM

## 2023-07-14 ENCOUNTER — Ambulatory Visit (HOSPITAL_COMMUNITY)
Admission: RE | Admit: 2023-07-14 | Discharge: 2023-07-14 | Disposition: A | Discharge status: Home / Self Care | Payer: Federal, State, Local not specified - PPO | Source: Ambulatory Visit | Attending: Family Medicine | Admitting: Family Medicine

## 2023-07-14 DIAGNOSIS — Z136 Encounter for screening for cardiovascular disorders: Secondary | ICD-10-CM | POA: Diagnosis not present

## 2023-07-14 DIAGNOSIS — E7800 Pure hypercholesterolemia, unspecified: Secondary | ICD-10-CM | POA: Insufficient documentation

## 2023-09-05 DIAGNOSIS — R7989 Other specified abnormal findings of blood chemistry: Secondary | ICD-10-CM | POA: Diagnosis not present

## 2023-11-17 DIAGNOSIS — R945 Abnormal results of liver function studies: Secondary | ICD-10-CM | POA: Diagnosis not present

## 2024-06-28 ENCOUNTER — Other Ambulatory Visit: Payer: Self-pay | Admitting: Family Medicine

## 2024-06-28 DIAGNOSIS — Z1231 Encounter for screening mammogram for malignant neoplasm of breast: Secondary | ICD-10-CM

## 2024-07-18 ENCOUNTER — Ambulatory Visit
Admission: RE | Admit: 2024-07-18 | Discharge: 2024-07-18 | Disposition: A | Discharge status: Home / Self Care | Source: Ambulatory Visit | Attending: Family Medicine | Admitting: Family Medicine

## 2024-07-18 DIAGNOSIS — E7800 Pure hypercholesterolemia, unspecified: Secondary | ICD-10-CM | POA: Diagnosis not present

## 2024-07-18 DIAGNOSIS — Z1231 Encounter for screening mammogram for malignant neoplasm of breast: Secondary | ICD-10-CM

## 2024-07-18 DIAGNOSIS — Z0000 Encounter for general adult medical examination without abnormal findings: Secondary | ICD-10-CM | POA: Diagnosis not present

## 2024-07-23 ENCOUNTER — Other Ambulatory Visit: Payer: Self-pay | Admitting: Family Medicine

## 2024-07-23 DIAGNOSIS — R928 Other abnormal and inconclusive findings on diagnostic imaging of breast: Secondary | ICD-10-CM

## 2024-08-06 ENCOUNTER — Ambulatory Visit

## 2024-08-06 ENCOUNTER — Ambulatory Visit
Admission: RE | Admit: 2024-08-06 | Discharge: 2024-08-06 | Disposition: A | Source: Ambulatory Visit | Attending: Family Medicine

## 2024-08-06 DIAGNOSIS — R928 Other abnormal and inconclusive findings on diagnostic imaging of breast: Secondary | ICD-10-CM
